# Patient Record
Sex: Female | Born: 2000 | Race: White | Hispanic: No | Marital: Single | Smoking: Never smoker
Health system: Southern US, Community
[De-identification: ages and names within clinical notes are randomized; demographics above are authoritative.]

---
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
## 2018-07-20 IMAGING — CR DG SHOULDER 2+V*L*
2 series · 2 of 2 positions shown · non-contrast
Comparison: None.

CLINICAL DATA: Status post injury to the left shoulder while ice
skating. Left shoulder pain. Initial encounter.

EXAM:
LEFT SHOULDER - 2+ VIEW

[w shoulder external left]
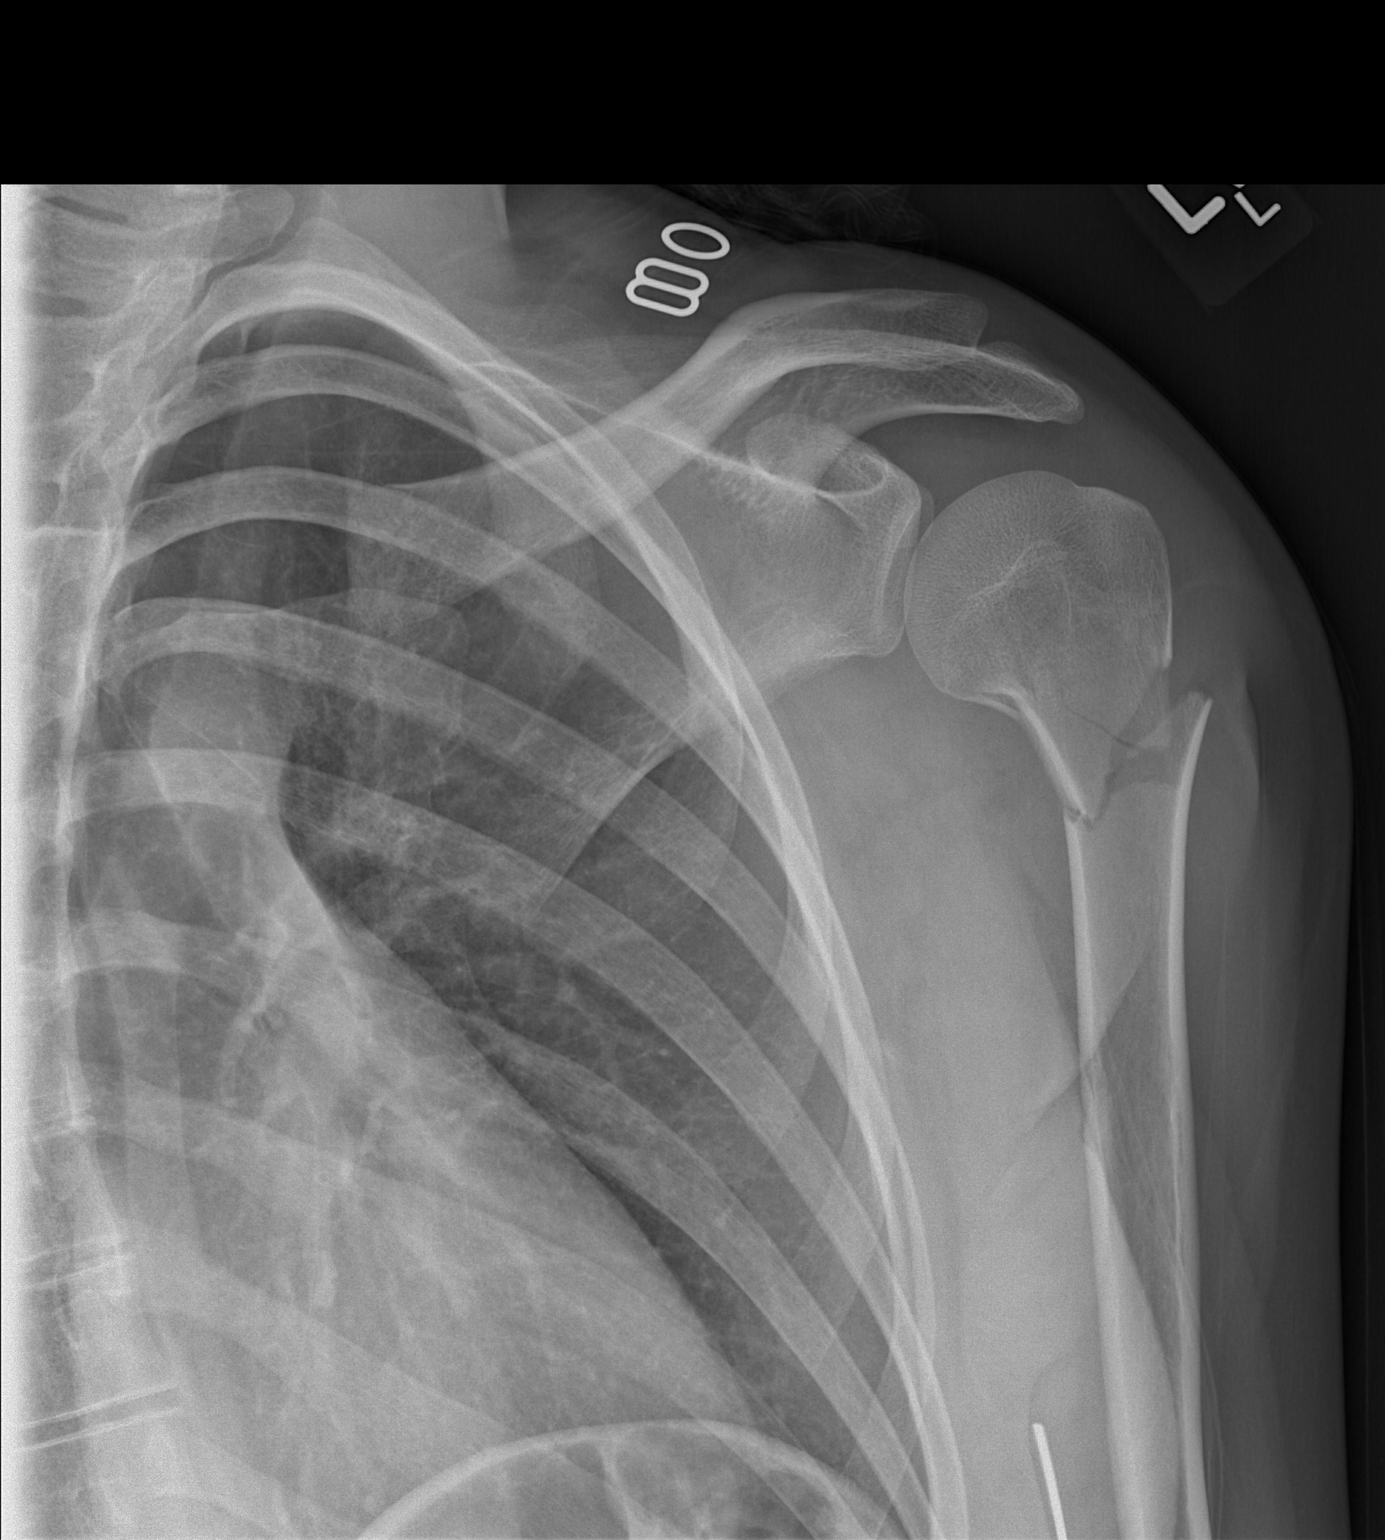

[w shoulder y-view left]
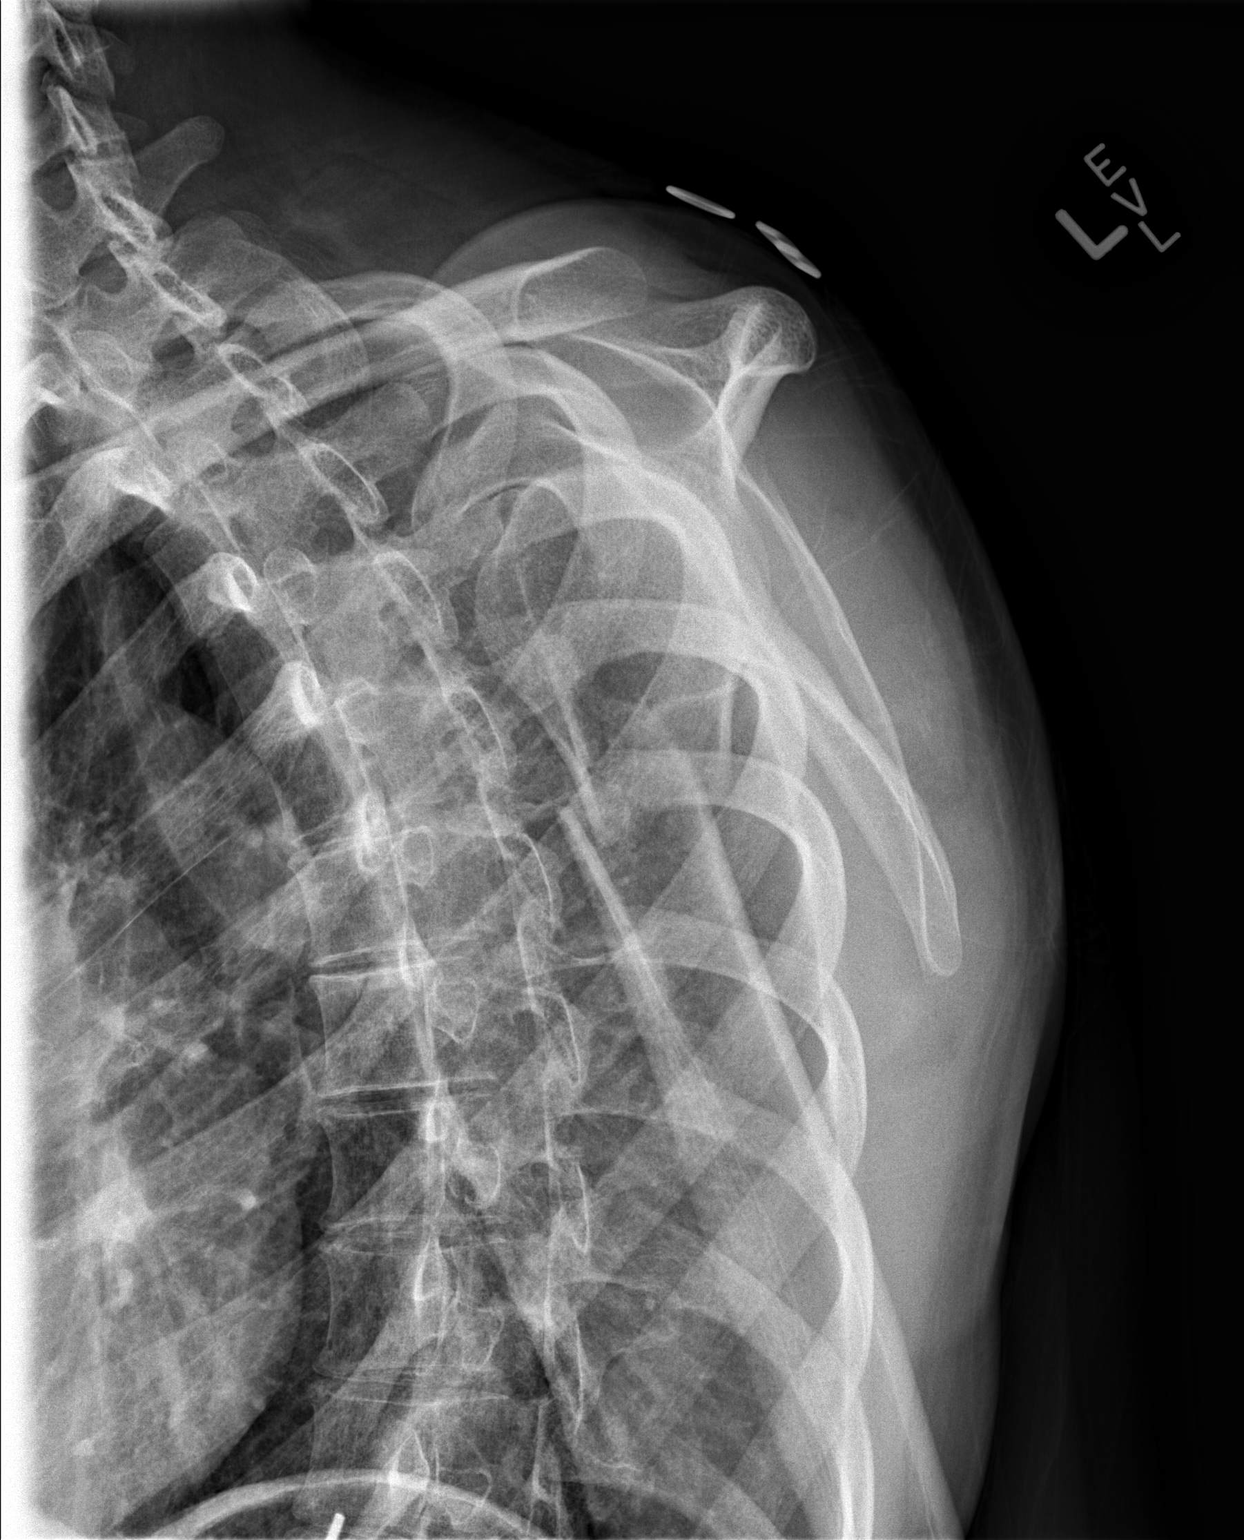

[2 of 2 positions shown; findings below may reference images not displayed]

FINDINGS: There is a mildly comminuted fracture at the proximal humeral
diaphysis, extending minimally into the humeral head, with medial
angulation. The left humeral head remains seated at the glenoid
fossa. The left acromioclavicular joint is unremarkable. The
visualized portions of the left lung are clear.
IMPRESSION: Mildly comminuted fracture at the proximal humeral diaphysis,
extending minimally into the humeral head, with medial angulation.
